# Patient Record
Sex: Male | Born: 1965 | Race: White | Hispanic: No | Marital: Married | Smoking: Former smoker
Health system: Southern US, Community
[De-identification: ages and names within clinical notes are randomized; demographics above are authoritative.]

## PROBLEM LIST (undated history)

## (undated) DIAGNOSIS — G4730 Sleep apnea, unspecified: Secondary | ICD-10-CM

## (undated) DIAGNOSIS — K219 Gastro-esophageal reflux disease without esophagitis: Secondary | ICD-10-CM

## (undated) DIAGNOSIS — E538 Deficiency of other specified B group vitamins: Secondary | ICD-10-CM

## (undated) DIAGNOSIS — I10 Essential (primary) hypertension: Secondary | ICD-10-CM

## (undated) DIAGNOSIS — F419 Anxiety disorder, unspecified: Secondary | ICD-10-CM

## (undated) DIAGNOSIS — M1990 Unspecified osteoarthritis, unspecified site: Secondary | ICD-10-CM

## (undated) HISTORY — PX: BACK SURGERY: SHX140

## (undated) HISTORY — PX: FRACTURE SURGERY: SHX138

## (undated) HISTORY — PX: TONSILLECTOMY: SUR1361

---

## 2016-05-03 ENCOUNTER — Ambulatory Visit (HOSPITAL_COMMUNITY)
Admission: EM | Admit: 2016-05-03 | Discharge: 2016-05-03 | Disposition: A | Discharge status: Home / Self Care | Payer: BLUE CROSS/BLUE SHIELD | Attending: Emergency Medicine | Admitting: Emergency Medicine

## 2016-05-03 ENCOUNTER — Encounter (HOSPITAL_COMMUNITY): Payer: Self-pay

## 2016-05-03 DIAGNOSIS — Z20828 Contact with and (suspected) exposure to other viral communicable diseases: Secondary | ICD-10-CM

## 2016-05-03 DIAGNOSIS — R6889 Other general symptoms and signs: Secondary | ICD-10-CM

## 2016-05-03 MED ORDER — BENZONATATE 100 MG PO CAPS: 100.0000 mg | ORAL_CAPSULE | Freq: Three times a day (TID) | ORAL | 0 refills | Status: DC

## 2016-05-03 MED ORDER — OSELTAMIVIR PHOSPHATE 75 MG PO CAPS: 75.0000 mg | ORAL_CAPSULE | Freq: Two times a day (BID) | ORAL | 0 refills | Status: DC

## 2016-05-03 MED ORDER — ACETAMINOPHEN 325 MG PO TABS
ORAL_TABLET | ORAL | Status: AC
  Filled 2016-05-03: qty 3

## 2016-05-03 MED ORDER — FLUTICASONE PROPIONATE 50 MCG/ACT NA SUSP: 2.0000 | Freq: Every day | NASAL | 2 refills | Status: AC

## 2016-05-03 MED ORDER — ACETAMINOPHEN 325 MG PO TABS
975.0000 mg | ORAL_TABLET | Freq: Once | ORAL | Status: AC
  Administered 2016-05-03: 975 mg via ORAL

## 2016-05-03 NOTE — ED Provider Notes (Signed)
CSN: [id number]     Arrival date & time 05/03/16  1708 History   First MD Initiated Contact with Patient 05/03/16 1828     Chief Complaint  Patient presents with  . Influenza   (Consider location/radiation/quality/duration/timing/severity/associated sxs/prior Treatment) HPI  Martin Burns is a 51 y.o. male presenting to UC with c/o sudden onset flu-like symptoms that started about 2-3 days ago.  Pt woke up the other day feeling a little sore but went to work, he then developed diffuse body aches, generalized headache, cough, congestion, and chills. Body aches are most bothersome for the pt.  Wife notes their son was dx with influenza last week. Pt has not received the flu vaccine this year. Denies n/v/d. Denies hx of asthma.    History reviewed. No pertinent past medical history. Past Surgical History:  Procedure Laterality Date  . BACK SURGERY     History reviewed. No pertinent family history. Social History  Substance Use Topics  . Smoking status: Never Smoker  . Smokeless tobacco: Never Used  . Alcohol use Yes    Review of Systems  Constitutional: Positive for chills and fatigue. Negative for fever.  HENT: Positive for congestion, postnasal drip and rhinorrhea. Negative for ear pain, sore throat, trouble swallowing and voice change.   Respiratory: Positive for cough. Negative for shortness of breath.   Cardiovascular: Negative for chest pain and palpitations.  Gastrointestinal: Negative for abdominal pain, diarrhea, nausea and vomiting.  Musculoskeletal: Positive for arthralgias and myalgias. Negative for back pain.       Body aches  Skin: Negative for rash.  Neurological: Positive for headaches. Negative for dizziness and light-headedness.    Allergies  Codeine  Home Medications   Prior to Admission medications   Medication Sig Start Date End Date Taking? Authorizing Provider  benzonatate (TESSALON) 100 MG capsule Take 1 capsule (100 mg total) by mouth every 8  (eight) hours. 05/03/16   Junius Finner, PA-C  fluticasone (FLONASE) 50 MCG/ACT nasal spray Place 2 sprays into both nostrils daily. 05/03/16   Junius Finner, PA-C  oseltamivir (TAMIFLU) 75 MG capsule Take 1 capsule (75 mg total) by mouth every 12 (twelve) hours. 05/03/16   Junius Finner, PA-C   Meds Ordered and Administered this Visit   Medications  acetaminophen (TYLENOL) tablet 975 mg (975 mg Oral Given 05/03/16 1845)    BP 145/90 (BP Location: Left Arm)   Pulse 105   Temp 100.9 F (38.3 C) (Oral)   Resp 17   SpO2 99%  No data found.   Physical Exam  Constitutional: He appears well-developed and well-nourished. No distress.  HENT:  Head: Normocephalic and atraumatic.  Right Ear: Tympanic membrane normal.  Left Ear: Tympanic membrane normal.  Nose: Mucosal edema present. Right sinus exhibits no maxillary sinus tenderness and no frontal sinus tenderness. Left sinus exhibits no maxillary sinus tenderness and no frontal sinus tenderness.  Mouth/Throat: Uvula is midline and mucous membranes are normal. Posterior oropharyngeal erythema present. No oropharyngeal exudate, posterior oropharyngeal edema or tonsillar abscesses.  Eyes: Conjunctivae are normal. No scleral icterus.  Neck: Normal range of motion. Neck supple.  Cardiovascular: Normal rate, regular rhythm and normal heart sounds.   Pulmonary/Chest: Effort normal and breath sounds normal. No stridor. No respiratory distress. He has no wheezes. He has no rales.  Abdominal: Soft. He exhibits no distension. There is no tenderness.  Musculoskeletal: Normal range of motion.  Lymphadenopathy:    He has no cervical adenopathy.  Neurological: He is alert.  Skin:  Skin is warm and dry. He is not diaphoretic.  Nursing note and vitals reviewed.   Urgent Care Course     Procedures (including critical care time)  Labs Review Labs Reviewed - No data to display  Imaging Review No results found.    MDM   1. Flu-like symptoms   2.  Exposure to the flu    Pt presenting to UC with 2-3 days of flu-like symptoms. Known exposure to the flu by pt's son.  No evidence of underlying bacterial infection at this time.  Discussed risks/benefits of Tamiflu. Pt would like to try the treatment. Rx: Tamiflu, tessalon and flonase  Advised pt to use acetaminophen and ibuprofen as needed for fever and pain. Encouraged rest and fluids. F/u with PCP in 5-7 days if not improving, sooner if worsening. Pt verbalized understanding and agreement with tx plan.      Junius Finnerrin O'Malley, PA-C 05/03/16 1928

## 2016-05-03 NOTE — Discharge Instructions (Signed)
You Martin Burns take 400-600mg Ibuprofen (Motrin) every 6-8 hours for fever and pain  °Alternate with Tylenol  °You Martin Burns take 500mg Tylenol every 4-6 hours as needed for fever and pain  °Follow-up with your primary care provider next week for recheck of symptoms if not improving.  °Be sure to drink plenty of fluids and rest, at least 8hrs of sleep a night, preferably more while you are sick. °Return urgent care or go to closest ER if you cannot keep down fluids/signs of dehydration, fever not reducing with Tylenol, difficulty breathing/wheezing, stiff neck, worsening condition, or other concerns (see below)  ° ° ° °

## 2016-05-03 NOTE — ED Triage Notes (Signed)
[facility] with possible Flu, pt states he woke up on Friday morning not feeling well went to work, still not feeling well, woke up this morning feeling worse, pt has complaints of chills and  generalized body aches x3 days

## 2017-09-13 ENCOUNTER — Other Ambulatory Visit: Payer: Self-pay | Admitting: Orthopedic Surgery

## 2017-09-13 DIAGNOSIS — L720 Epidermal cyst: Secondary | ICD-10-CM

## 2017-09-20 ENCOUNTER — Other Ambulatory Visit: Payer: BLUE CROSS/BLUE SHIELD

## 2017-09-21 ENCOUNTER — Ambulatory Visit
Admission: RE | Admit: 2017-09-21 | Discharge: 2017-09-21 | Disposition: A | Discharge status: Home / Self Care | Payer: Worker's Compensation | Source: Ambulatory Visit | Attending: Orthopedic Surgery | Admitting: Orthopedic Surgery

## 2017-09-21 DIAGNOSIS — L720 Epidermal cyst: Secondary | ICD-10-CM

## 2017-09-21 MED ORDER — GADOBENATE DIMEGLUMINE 529 MG/ML IV SOLN
20.0000 mL | Freq: Once | INTRAVENOUS | Status: AC | PRN
  Administered 2017-09-21: 20 mL via INTRAVENOUS

## 2018-01-24 ENCOUNTER — Other Ambulatory Visit: Payer: Self-pay | Admitting: Internal Medicine

## 2018-01-24 DIAGNOSIS — M5440 Lumbago with sciatica, unspecified side: Secondary | ICD-10-CM

## 2018-02-14 ENCOUNTER — Ambulatory Visit
Admission: RE | Admit: 2018-02-14 | Discharge: 2018-02-14 | Disposition: A | Discharge status: Home / Self Care | Payer: BLUE CROSS/BLUE SHIELD | Source: Ambulatory Visit | Attending: Internal Medicine | Admitting: Internal Medicine

## 2018-02-14 DIAGNOSIS — M5136 Other intervertebral disc degeneration, lumbar region: Secondary | ICD-10-CM | POA: Insufficient documentation

## 2018-02-14 DIAGNOSIS — M48061 Spinal stenosis, lumbar region without neurogenic claudication: Secondary | ICD-10-CM | POA: Diagnosis not present

## 2018-02-14 DIAGNOSIS — G8929 Other chronic pain: Secondary | ICD-10-CM | POA: Insufficient documentation

## 2018-02-14 DIAGNOSIS — M5441 Lumbago with sciatica, right side: Secondary | ICD-10-CM | POA: Diagnosis not present

## 2018-02-14 DIAGNOSIS — M5440 Lumbago with sciatica, unspecified side: Secondary | ICD-10-CM

## 2018-02-24 ENCOUNTER — Other Ambulatory Visit: Payer: Self-pay | Admitting: Neurosurgery

## 2018-02-24 DIAGNOSIS — G9589 Other specified diseases of spinal cord: Secondary | ICD-10-CM

## 2018-03-17 ENCOUNTER — Ambulatory Visit
Admission: RE | Admit: 2018-03-17 | Discharge: 2018-03-17 | Disposition: A | Discharge status: Home / Self Care | Payer: BLUE CROSS/BLUE SHIELD | Source: Ambulatory Visit | Attending: Neurosurgery | Admitting: Neurosurgery

## 2018-03-17 DIAGNOSIS — M47816 Spondylosis without myelopathy or radiculopathy, lumbar region: Secondary | ICD-10-CM | POA: Diagnosis not present

## 2018-03-17 DIAGNOSIS — G9589 Other specified diseases of spinal cord: Secondary | ICD-10-CM | POA: Diagnosis present

## 2018-03-17 DIAGNOSIS — M5136 Other intervertebral disc degeneration, lumbar region: Secondary | ICD-10-CM | POA: Insufficient documentation

## 2018-03-17 MED ORDER — GADOBUTROL 1 MMOL/ML IV SOLN
10.0000 mL | Freq: Once | INTRAVENOUS | Status: AC | PRN
  Administered 2018-03-17: 10 mL via INTRAVENOUS

## 2018-03-28 ENCOUNTER — Other Ambulatory Visit: Payer: Self-pay | Admitting: Neurosurgery

## 2018-03-28 DIAGNOSIS — G9589 Other specified diseases of spinal cord: Secondary | ICD-10-CM

## 2018-04-27 ENCOUNTER — Encounter
Admission: RE | Disposition: A | Discharge status: Home / Self Care | Payer: Self-pay | Source: Home / Self Care | Attending: Internal Medicine

## 2018-04-27 ENCOUNTER — Ambulatory Visit
Admission: RE | Admit: 2018-04-27 | Discharge: 2018-04-27 | Disposition: A | Discharge status: Home / Self Care | Payer: BLUE CROSS/BLUE SHIELD | Attending: Internal Medicine | Admitting: Internal Medicine

## 2018-04-27 ENCOUNTER — Encounter: Payer: Self-pay | Admitting: Anesthesiology

## 2018-04-27 ENCOUNTER — Ambulatory Visit: Payer: BLUE CROSS/BLUE SHIELD | Admitting: Anesthesiology

## 2018-04-27 DIAGNOSIS — Z1211 Encounter for screening for malignant neoplasm of colon: Secondary | ICD-10-CM | POA: Insufficient documentation

## 2018-04-27 DIAGNOSIS — K219 Gastro-esophageal reflux disease without esophagitis: Secondary | ICD-10-CM | POA: Diagnosis not present

## 2018-04-27 DIAGNOSIS — Z8371 Family history of colonic polyps: Secondary | ICD-10-CM | POA: Diagnosis not present

## 2018-04-27 DIAGNOSIS — K5730 Diverticulosis of large intestine without perforation or abscess without bleeding: Secondary | ICD-10-CM | POA: Insufficient documentation

## 2018-04-27 DIAGNOSIS — Z800 Family history of malignant neoplasm of digestive organs: Secondary | ICD-10-CM | POA: Insufficient documentation

## 2018-04-27 DIAGNOSIS — K228 Other specified diseases of esophagus: Secondary | ICD-10-CM | POA: Diagnosis not present

## 2018-04-27 DIAGNOSIS — Z791 Long term (current) use of non-steroidal anti-inflammatories (NSAID): Secondary | ICD-10-CM | POA: Insufficient documentation

## 2018-04-27 DIAGNOSIS — K640 First degree hemorrhoids: Secondary | ICD-10-CM | POA: Diagnosis not present

## 2018-04-27 HISTORY — PX: ESOPHAGOGASTRODUODENOSCOPY: SHX5428

## 2018-04-27 HISTORY — DX: Gastro-esophageal reflux disease without esophagitis: K21.9

## 2018-04-27 HISTORY — PX: COLONOSCOPY WITH PROPOFOL: SHX5780

## 2018-04-27 SURGERY — EGD (ESOPHAGOGASTRODUODENOSCOPY)
Anesthesia: General

## 2018-04-27 MED ORDER — SODIUM CHLORIDE 0.9 % IV SOLN
INTRAVENOUS | Status: DC
  Administered 2018-04-27 (×2): via INTRAVENOUS

## 2018-04-27 MED ORDER — PROPOFOL 500 MG/50ML IV EMUL
INTRAVENOUS | Status: DC | PRN
  Administered 2018-04-27: 100 ug/kg/min via INTRAVENOUS

## 2018-04-27 MED ORDER — PROPOFOL 10 MG/ML IV BOLUS
INTRAVENOUS | Status: DC | PRN
  Administered 2018-04-27: 50 mg via INTRAVENOUS

## 2018-04-27 NOTE — Interval H&P Note (Signed)
History and Physical Interval Note:  04/27/2018 12:03 PM  Martin Burns  has presented today for surgery, with the diagnosis of COLON CANCER SCREENING  The various methods of treatment have been discussed with the patient and family. After consideration of risks, benefits and other options for treatment, the patient has consented to  Procedure(s): ESOPHAGOGASTRODUODENOSCOPY (EGD) (N/A) COLONOSCOPY WITH PROPOFOL (N/A) as a surgical intervention .  The patient's history has been reviewed, patient examined, no change in status, stable for surgery.  I have reviewed the patient's chart and labs.  Questions were answered to the patient's satisfaction.     [place], [place]

## 2018-04-27 NOTE — Anesthesia Postprocedure Evaluation (Signed)
Anesthesia Post Note  Patient: Terah Viens Getchell  Procedure(s) Performed: ESOPHAGOGASTRODUODENOSCOPY (EGD) (N/A ) COLONOSCOPY WITH PROPOFOL (N/A )  Patient location during evaluation: PACU Anesthesia Type: General Level of consciousness: awake and alert Pain management: pain level controlled Vital Signs Assessment: post-procedure vital signs reviewed and stable Respiratory status: spontaneous breathing, nonlabored ventilation, respiratory function stable and patient connected to nasal cannula oxygen Cardiovascular status: stable and blood pressure returned to baseline Postop Assessment: no apparent nausea or vomiting Anesthetic complications: no     Last Vitals:  Vitals:   04/27/18 1239 04/27/18 1249  BP: 106/70 122/65  Pulse: (!) 104 90  Resp: 16 17  Temp: 37 C   SpO2: 95% 95%    Last Pain:  Vitals:   04/27/18 1237  TempSrc: Tympanic                 Yevette Edwards

## 2018-04-27 NOTE — Anesthesia Post-op Follow-up Note (Signed)
Anesthesia QCDR form completed.        

## 2018-04-27 NOTE — Op Note (Signed)
[facility] Gastroenterology Patient Name: Martin Burns Procedure Date: 04/27/2018 11:07 AM MRN: [id number] Account #: [id number] Date of Birth: [birth year] Admit Type: Outpatient Age: 53 Room: [facility] ENDO ROOM 2 Gender: Male Note Status: Finalized Procedure:            Upper GI endoscopy Indications:          Follow-up of esophageal reflux Providers:            Boykin Nearing. Norma Fredrickson MD, MD Referring MD:         Duane Lope. Judithann Sheen, MD (Referring MD) Medicines:            Propofol per Anesthesia Complications:        No immediate complications. Procedure:            Pre-Anesthesia Assessment:                       - The risks and benefits of the procedure and the                        sedation options and risks were discussed with the                        patient. All questions were answered and informed                        consent was obtained.                       - Patient identification and proposed procedure were                        verified prior to the procedure by the nurse. The                        procedure was verified in the procedure room.                       - ASA Grade Assessment: III - A patient with severe                        systemic disease.                       - After reviewing the risks and benefits, the patient                        was deemed in satisfactory condition to undergo the                        procedure.                       After obtaining informed consent, the endoscope was                        passed under direct vision. Throughout the procedure,                        the patient's blood pressure, pulse, and oxygen  saturations were monitored continuously. The Endoscope                        was introduced through the mouth, and advanced to the                        third part of duodenum. The upper GI endoscopy was                        accomplished without difficulty. The patient tolerated                         the procedure well. Findings:      The Z-line was irregular and was found at the gastroesophageal junction.       Mucosa was biopsied with a cold forceps for histology. One specimen       bottle was sent to pathology.      The entire examined stomach was normal.      The examined duodenum was normal.      The exam was otherwise without abnormality. Impression:           - Z-line irregular, at the gastroesophageal junction.                        Biopsied.                       - Normal stomach.                       - Normal examined duodenum.                       - The examination was otherwise normal. Recommendation:       - Await pathology results.                       - Proceed with colonoscopy Procedure Code(s):    --- Professional ---                       [id number], Esophagogastroduodenoscopy, flexible, transoral;                        with biopsy, single or multiple Diagnosis Code(s):    --- Professional ---                       K21.9, Gastro-esophageal reflux disease without                        esophagitis                       K22.8, Other specified diseases of esophagus CPT copyright 2018 American Medical Association. All rights reserved. The codes documented in this report are preliminary and upon coder review [place]  be revised to meet current compliance requirements. [name] MD, MD 04/27/2018 12:23:06 PM This report has been signed electronically. Number of Addenda: 0 Note Initiated On: 04/27/2018 11:07 AM      [facility]

## 2018-04-27 NOTE — Op Note (Signed)
[facility] Gastroenterology Patient Name: Martin Burns Procedure Date: 04/27/2018 11:07 AM MRN: [id number] Account #: [id number] Date of Birth: 08/10/1965 Admit Type: Outpatient Age: [age] Room: [facility] END[facility] ROOM 2 Gender: Male Note Status: Finalized Procedure:            Colonoscopy Indications:          Screening for colorectal malignant neoplasm Providers:            Boykin Nearingeodoro K. Norma Fredricksonoledo MD, MD Referring MD:         Duane LopeJeffrey D. Judithann SheenSparks, MD (Referring MD) Medicines:            Propofol per Anesthesia Complications:        No immediate complications. Procedure:            Pre-Anesthesia Assessment:                       - The risks and benefits of the procedure and the                        sedation options and risks were discussed with the                        patient. All questions were answered and informed                        consent was obtained.                       - Patient identification and proposed procedure were                        verified prior to the procedure by the nurse. The                        procedure was verified in the procedure room.                       - ASA Grade Assessment: III - A patient with severe                        systemic disease.                       - After reviewing the risks and benefits, the patient                        was deemed in satisfactory condition to undergo the                        procedure.                       After obtaining informed consent, the colonoscope was                        passed under direct vision. Throughout the procedure,                        the patient's blood pressure, pulse, and oxygen  saturations were monitored continuously. The                        Colonoscope was introduced through the anus and                        advanced to the the cecum, identified by appendiceal                        orifice and ileocecal valve. The colonoscopy was               performed without difficulty. The patient tolerated the                        procedure well. The quality of the bowel preparation                        was good. The ileocecal valve, appendiceal orifice, and                        rectum were photographed. Findings:      The perianal and digital rectal examinations were normal. Pertinent       negatives include normal sphincter tone and no palpable rectal lesions.      Many small and large-mouthed diverticula were found in the sigmoid colon.      Non-bleeding internal hemorrhoids were found during retroflexion. The       hemorrhoids were Grade I (internal hemorrhoids that do not prolapse).      The exam was otherwise without abnormality. Impression:           - Diverticulosis in the sigmoid colon.                       - Non-bleeding internal hemorrhoids.                       - The examination was otherwise normal.                       - No specimens collected. Recommendation:       - Patient has a contact number available for                        emergencies. The signs and symptoms of potential                        delayed complications were discussed with the patient.                        Return to normal activities tomorrow. Written discharge                        instructions were provided to the patient.                       - Resume previous diet.                       - Continue present medications.                       - Repeat colonoscopy  in 10 years for screening purposes.                       - Await pathology results from EGD, also performed                        today.                       - Return to physician assistant in 1 year.                       - The findings and recommendations were discussed with                        the patient and their family. Procedure Code(s):    --- Professional ---                       G0121, Colorectal cancer screening; colonoscopy on Z6109                        individual not meeting criteria for high risk Diagnosis Code(s):    --- Professional ---                       K57.30, Diverticulosis of large intestine without                        perforation or abscess without bleeding                       K64.0, First degree hemorrhoids                       Z12.11, Encounter for screening for malignant neoplasm                        of colon CPT copyright 2018 American Medical Association. All rights reserved. The codes documented in this report are preliminary and upon coder review [name]  be revised to meet current compliance requirements. Stanton Kidneyeodoro K Aitanna Haubner MD, MD 04/27/2018 12:34:48 PM This report has been signed electronically. Number of Addenda: 0 Note Initiated On: 04/27/2018 11:07 AM Scope Withdrawal Time: 0 hours 4 minutes 48 seconds  Total Procedure Duration: 0 hours 7 minutes 17 seconds       [facility]

## 2018-04-27 NOTE — H&P (Signed)
Outpatient short stay form Pre-procedure 04/27/2018 10:34 AM Martin Burns [name], M.D.  Primary Physician: Aram Beecham, M.D.  Reason for visit:  GERD, Family history of colon polyps (Father).  History of present illness:  Patient has a hx of uncomplicated GERD, episodic.   53 year old patient presenting for family history of colon cancer. Patient denies any change in bowel habits, rectal bleeding or involuntary weight loss.   No current facility-administered medications for this encounter.   Current Outpatient Medications:  .  bisoprolol-hydrochlorothiazide (ZIAC) 5-6.25 MG tablet, Take 1 tablet by mouth daily., Disp: , Rfl:  .  diazepam (VALIUM) 2 MG tablet, Take 2 mg by mouth every 8 (eight) hours as needed for anxiety., Disp: , Rfl:  .  GLUCOSAMINE SULFATE-MSM PO, Take 2 tablets by mouth daily., Disp: , Rfl:  .  ibuprofen (ADVIL,MOTRIN) 800 MG tablet, Take 800 mg by mouth every 8 (eight) hours as needed., Disp: , Rfl:  .  Multiple Vitamins-Minerals (MULTIVITAMIN ADULT PO), Take 1 tablet by mouth daily., Disp: , Rfl:  .  benzonatate (TESSALON) 100 MG capsule, Take 1 capsule (100 mg total) by mouth every 8 (eight) hours., Disp: 21 capsule, Rfl: 0 .  fluticasone (FLONASE) 50 MCG/ACT nasal spray, Place 2 sprays into both nostrils daily., Disp: 16 g, Rfl: 2 .  oseltamivir (TAMIFLU) 75 MG capsule, Take 1 capsule (75 mg total) by mouth every 12 (twelve) hours., Disp: 10 capsule, Rfl: 0  No medications prior to admission.     Allergies  Allergen Reactions  . Codeine Nausea And Vomiting     Past Medical History:  Diagnosis Date  . GERD (gastroesophageal reflux disease)     Review of systems:  Otherwise negative.    Physical Exam  Gen: Alert, oriented. Appears stated age.  HEENT: [place]/AT. PERRLA. Lungs: CTA, no wheezes. CV: RR nl S1, S2. Abd: soft, benign, no masses. BS+ Ext: No edema. Pulses 2+    Planned procedures: Proceed with EGD and colonoscopy. The patient  understands the nature of the planned procedure, indications, risks, alternatives and potential complications including but not limited to bleeding, infection, perforation, damage to internal organs and possible oversedation/side effects from anesthesia. The patient agrees and gives consent to proceed.  Please refer to procedure notes for findings, recommendations and patient disposition/instructions.     Contina Strain [name], M.D. Gastroenterology 04/27/2018  10:34 AM

## 2018-04-27 NOTE — Anesthesia Preprocedure Evaluation (Signed)
Anesthesia Evaluation  Patient identified by MRN, date of birth, ID band Patient awake    Reviewed: Allergy & Precautions, H&P , NPO status , Patient's Chart, lab work & pertinent test results  History of Anesthesia Complications Negative for: history of anesthetic complications  Airway Mallampati: III  TM Distance: <3 FB Neck ROM: limited    Dental  (+) Chipped, Poor Dentition   Pulmonary neg shortness of breath, former smoker,  Signs and symptoms suggestive of sleep apnea            Cardiovascular Exercise Tolerance: Good (-) angina(-) Past MI and (-) DOE negative cardio ROS       Neuro/Psych negative neurological ROS  negative psych ROS   GI/Hepatic Neg liver ROS, GERD  Medicated and Controlled,  Endo/Other  negative endocrine ROS  Renal/GU negative Renal ROS  negative genitourinary   Musculoskeletal   Abdominal   Peds  Hematology negative hematology ROS (+)   Anesthesia Other Findings Past Medical History: No date: GERD (gastroesophageal reflux disease)  Past Surgical History: No date: BACK SURGERY  BMI    Body Mass Index:  41.35 kg/m      Reproductive/Obstetrics negative OB ROS                             Anesthesia Physical Anesthesia Plan  ASA: III  Anesthesia Plan: General   Post-op Pain Management:    Induction: Intravenous  PONV Risk Score and Plan: Propofol infusion and TIVA  Airway Management Planned: Natural Airway and Nasal Cannula  Additional Equipment:   Intra-op Plan:   Post-operative Plan:   Informed Consent: I have reviewed the patients History and Physical, chart, labs and discussed the procedure including the risks, benefits and alternatives for the proposed anesthesia with the patient or authorized representative who has indicated his/her understanding and acceptance.     Dental Advisory Given  Plan Discussed with: Anesthesiologist,  CRNA and Surgeon  Anesthesia Plan Comments: (Patient consented for risks of anesthesia including but not limited to:  - adverse reactions to medications - risk of intubation if required - damage to teeth, lips or other oral mucosa - sore throat or hoarseness - Damage to heart, brain, lungs or loss of life  Patient voiced understanding.)        Anesthesia Quick Evaluation

## 2018-04-27 NOTE — Transfer of Care (Signed)
Immediate Anesthesia Transfer of Care Note  Patient: Martin Burns  Procedure(s) Performed: ESOPHAGOGASTRODUODENOSCOPY (EGD) (N/A ) COLONOSCOPY WITH PROPOFOL (N/A )  Patient Location: PACU  Anesthesia Type:General  Level of Consciousness: awake and alert   Airway & Oxygen Therapy: Patient Spontanous Breathing  Post-op Assessment: Report given to RN  Post vital signs: Reviewed and stable  Last Vitals:  Vitals Value Taken Time  BP 106/70 04/27/2018 12:39 PM  Temp 37 C 04/27/2018 12:39 PM  Pulse 102 04/27/2018 12:40 PM  Resp 19 04/27/2018 12:40 PM  SpO2 95 % 04/27/2018 12:40 PM  Vitals shown include unvalidated device data.  Last Pain:  Vitals:   04/27/18 1237  TempSrc: Tympanic         Complications: No apparent anesthesia complications

## 2018-04-28 LAB — SURGICAL PATHOLOGY

## 2018-05-06 ENCOUNTER — Ambulatory Visit: Payer: BLUE CROSS/BLUE SHIELD | Attending: Neurology

## 2018-05-06 DIAGNOSIS — G4730 Sleep apnea, unspecified: Secondary | ICD-10-CM | POA: Diagnosis present

## 2018-06-24 ENCOUNTER — Ambulatory Visit: Payer: BLUE CROSS/BLUE SHIELD

## 2018-06-27 ENCOUNTER — Other Ambulatory Visit: Payer: Self-pay

## 2018-06-27 ENCOUNTER — Ambulatory Visit
Admission: RE | Admit: 2018-06-27 | Discharge: 2018-06-27 | Disposition: A | Discharge status: Home / Self Care | Payer: BLUE CROSS/BLUE SHIELD | Source: Ambulatory Visit | Attending: Neurosurgery | Admitting: Neurosurgery

## 2018-06-27 DIAGNOSIS — G9589 Other specified diseases of spinal cord: Secondary | ICD-10-CM | POA: Insufficient documentation

## 2018-06-27 LAB — POCT I-STAT CREATININE: CREATININE: 0.9 mg/dL (ref 0.61–1.24)

## 2018-06-27 MED ORDER — GADOBUTROL 1 MMOL/ML IV SOLN
10.0000 mL | Freq: Once | INTRAVENOUS | Status: AC | PRN
  Administered 2018-06-27: 10 mL via INTRAVENOUS

## 2020-03-16 IMAGING — MR MR LUMBAR SPINE WO/W CM
6 of 7 series · 31 of 48 positions shown · IV contrast (Gadavist)
Comparison: 02/14/2018

CLINICAL DATA: Follow-up previous abnormal MRI.

EXAM:
MRI LUMBAR SPINE WITHOUT AND WITH CONTRAST
TECHNIQUE: Multiplanar and multiecho pulse sequences of the lumbar spine were
obtained without and with intravenous contrast.
CONTRAST:  10 cc Gadavist

[Series 5: T1 · sagittal · 4.0mm · 0.81mm/px · 5 of 17 slices shown (1 of 2)]
[im 1/17]
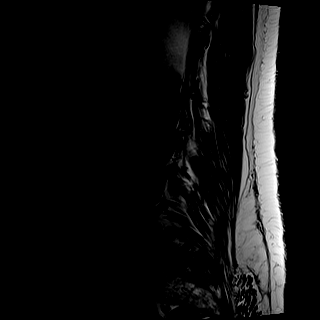
[im 5/17]
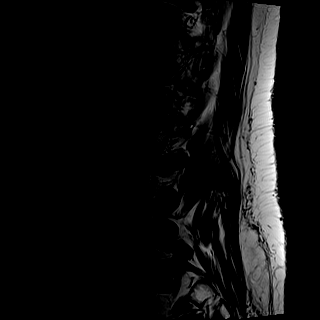
[im 9/17]
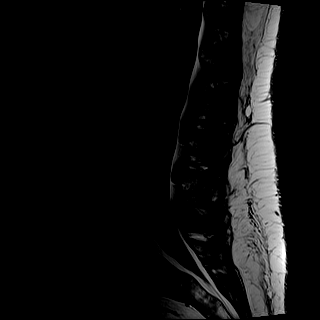
[im 13/17]
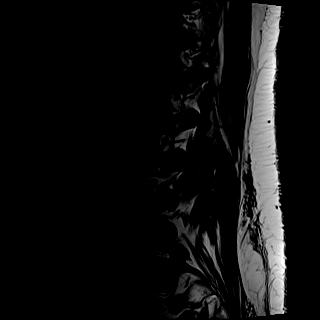
[im 17/17]
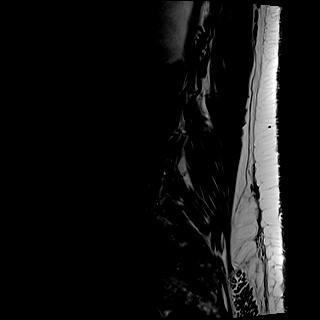

[Series 5: T1 fat-sat post-contrast · sagittal · 4.0mm · 0.81mm/px · 5 of 17 slices shown]
[im 1/17]
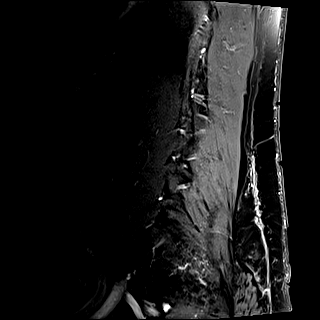
[im 5/17]
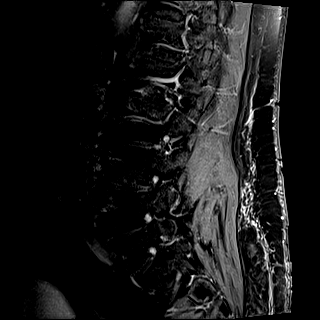
[im 9/17]
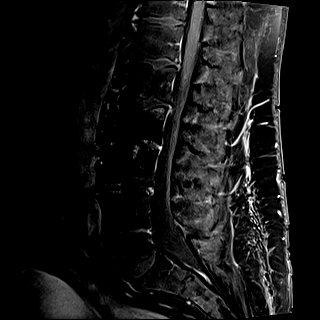
[im 13/17]
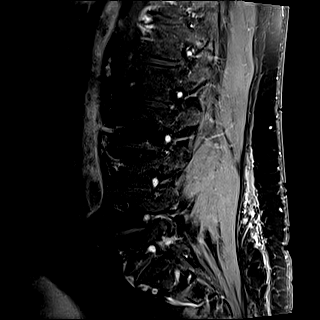
[im 17/17]
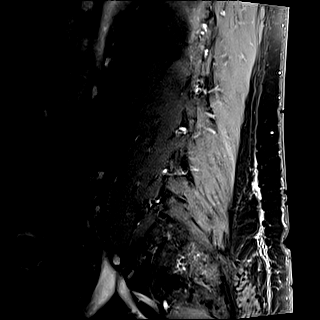

[Series 6: STIR · sagittal · 4.0mm · 0.41mm/px · 1 of 17 slices shown]
[im 1/17]
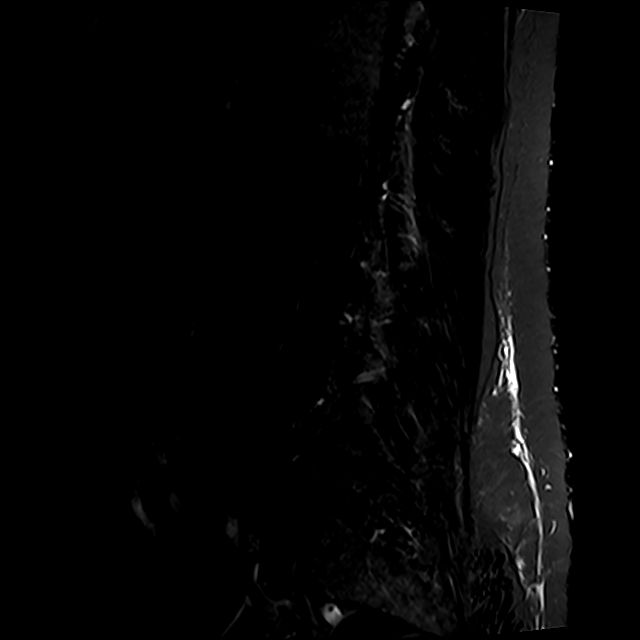

[Series 7: T2 · axial · 4.0mm · 0.78mm/px · z∈[-179,+65]mm · 8 of 42 slices shown (1 of 2)]
[im 1/42]
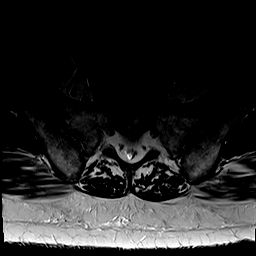
[im 5/42]
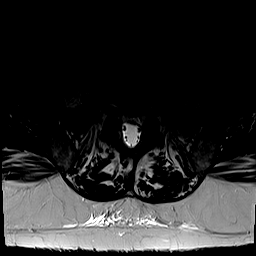
[im 14/42]
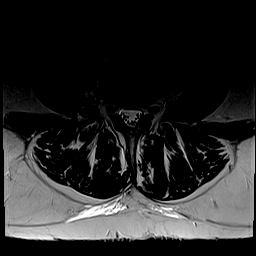
[im 19/42]
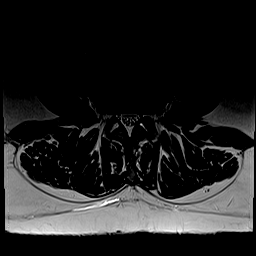
[im 23/42]
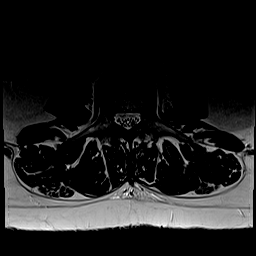
[im 28/42]
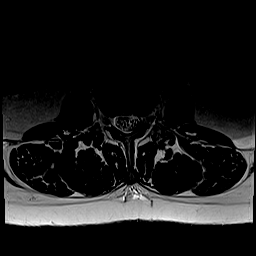
[im 37/42]
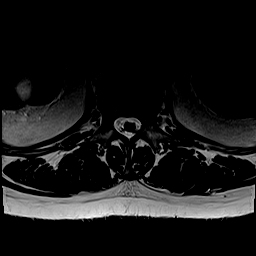
[im 42/42]
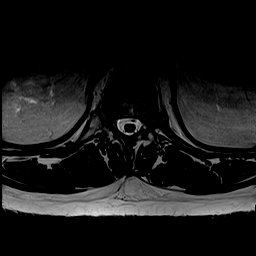

[Series 8: T1 · axial · 4.0mm · 0.39mm/px · z∈[-179,+65]mm · 8 of 42 slices shown (2 of 2)]
[im 1/42]
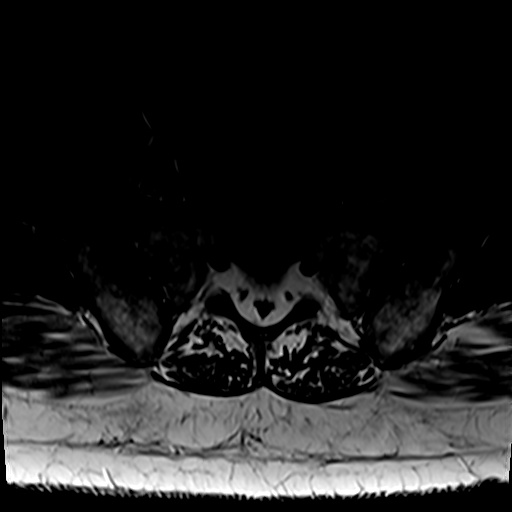
[im 5/42]
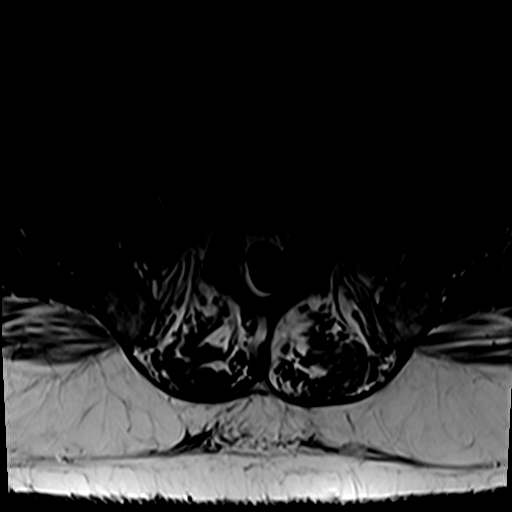
[im 14/42]
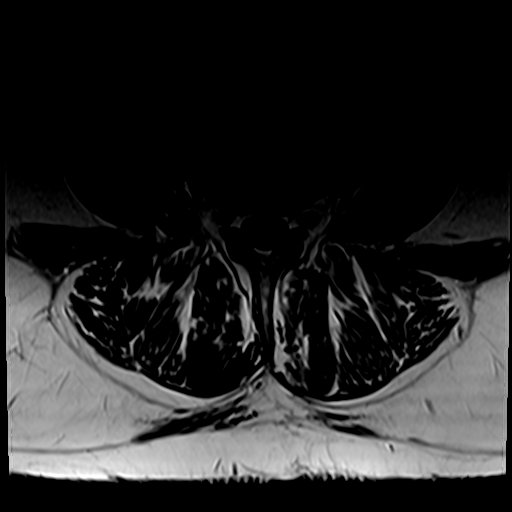
[im 19/42]
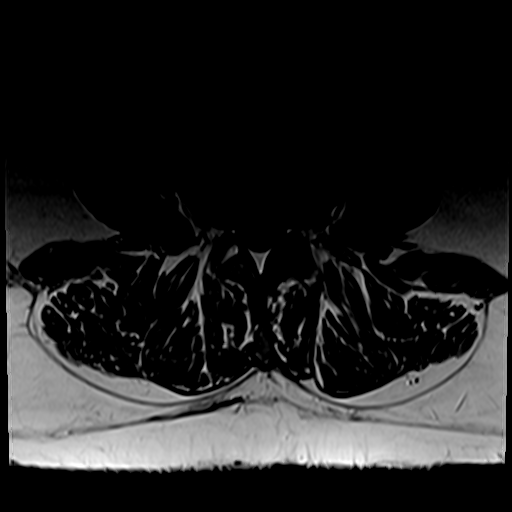
[im 23/42]
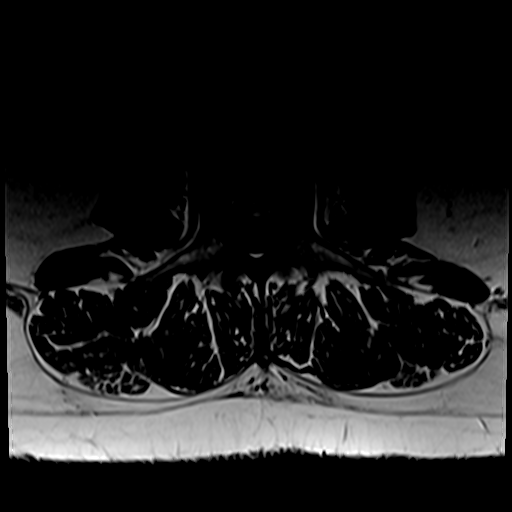
[im 28/42]
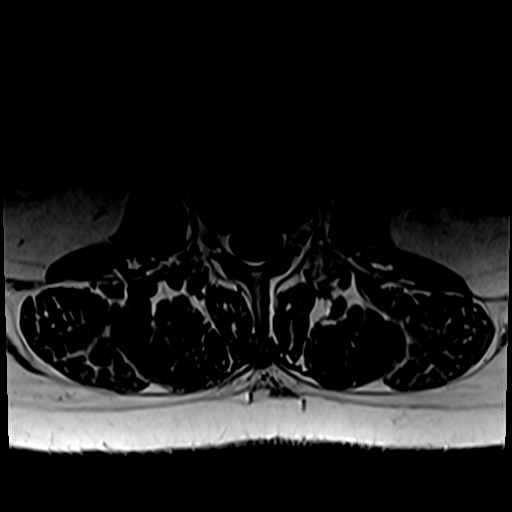
[im 37/42]
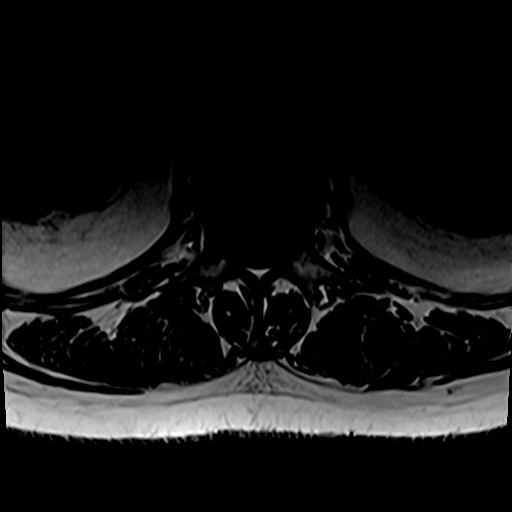
[im 42/42]
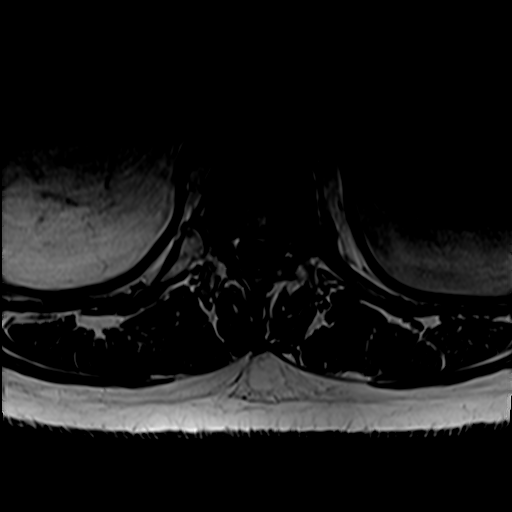

[Series 9: T2 · sagittal · 4.0mm · 0.81mm/px · 4 of 17 slices shown (2 of 2)]
[im 1/17]
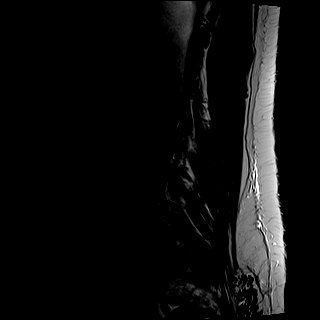
[im 6/17]
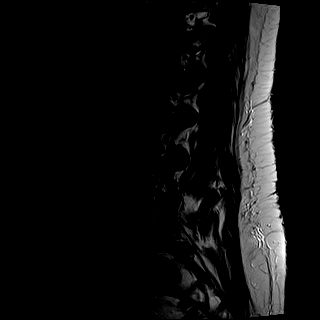
[im 11/17]
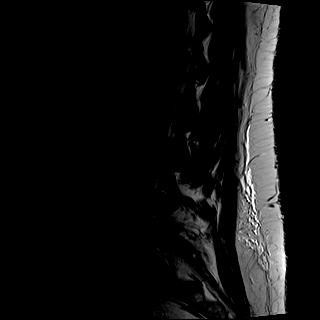
[im 17/17]
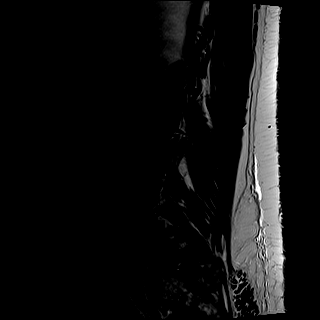

[31 of 48 positions shown; findings below may reference images not displayed]

FINDINGS: Segmentation: There are five lumbar type vertebral bodies. The last
full intervertebral disc space is labeled L5-S1. This corresponds
with the prior MRI.

Alignment: Suspect bilateral pars defects at L5 with a grade 1
spondylolisthesis. Stable postoperative changes at L5-S1 with
decompressive laminectomy.

Vertebrae: No bone lesions or fractures. Advanced fatty endplate
reactive changes are again demonstrated.

Conus medullaris and cauda equina: Conus extends to the L1 level.
Conus and cauda equina appear normal.

Paraspinal and other soft tissues: No significant findings. Stable
right renal cyst.

Disc levels:

T12-L1: As suspected on the prior MRI there is a small enhancing
nodule at the disc space level which appears to be intradural. There
is also some enhancement of the posterior aspect of the disc and 80
enhancing annular fissure. This could be a small intradural disc
herniation or a small nerve sheath tumor. Recommend neurosurgical
consultation and correlation with patient's symptoms.

The other intervertebral disc space labels are stable as recently
reported.
IMPRESSION: 1. Small, 4 mm focus of enhancement noted along the right aspect of
the conus at the T12-L1 level. This could be a small intradural disc
protrusion or possibly a small nerve root tumor. Recommend neuro
surgery consultation and correlation with patient's symptoms.
2. Stable degenerative lumbar spondylosis with disc disease and
facet disease.

## 2021-01-03 ENCOUNTER — Other Ambulatory Visit: Payer: Self-pay | Admitting: General Surgery

## 2021-01-03 NOTE — Progress Notes (Signed)
Subjective:     Patient ID: Martin Burns is a 55 y.o. male.   HPI   The following portions of the patient's history were reviewed and updated as appropriate.   This a new patient is here today for: office visit. The patient has been referred today by Dr. Doy Hutching for evaluation of an umbilical hernia.    Patient states that he has it for 20 years but it does not bother him. He states bowel movements are every day or every other day.   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      Chief Complaint  Patient presents with   Umbilical Hernia      BP (!) 142/72   Pulse 93   Temp 36.8 C (98.2 F)   Ht 175.3 cm (_0 )   Wt (!) 132 kg (291 lb)   SpO2 95%   BMI 42.97 kg/m        Past Medical History:  Diagnosis Date   Allergy 2005   Arthritis     Back pain     GERD without esophagitis     Hypertension 01/21/18   Iron deficiency anemia secondary to inadequate dietary iron intake 12/11/2020   Low serum HDL     Muscle spasm     Sleep apnea 09/ 19    Getting check for it           Past Surgical History:  Procedure Laterality Date   COLONOSCOPY   04/27/2018    Diverticulosis/Otherwise normal colon/Repeat 92yr/TKT   EGD   04/27/2018    GERD/No Repeat/TKT   HERNIA REPAIR   1999    Discetomy   SPINE SURGERY       TONSILLECTOMY        When i was younger          Social History           Socioeconomic History   Marital status: Married  Tobacco Use   Smoking status: Former Smoker      Packs/day: 0.00      Years: 0.00      Pack years: 0.00      Types: Cigarettes      Quit date: 2004      Years since quitting: 17.9   Smokeless tobacco: Current User      Types: Chew   Tobacco comment: uses dip  Vaping Use   Vaping Use: Never used  Substance and Sexual Activity   Alcohol use: Yes      Alcohol/week: 0.0 standard drinks      Comment: Once a week   Drug use: Never   Sexual activity: Yes      Partners: Female      Birth  control/protection: I.U.D.            Allergies  Allergen Reactions   Codeine Nausea and Vomiting      Current Medications        Current Outpatient Medications  Medication Sig Dispense Refill   amLODIPine-benazepril (LOTREL) 5-40 mg capsule Take 1 capsule by mouth once daily 90 capsule 3   cyanocobalamin (VITAMIN B12) 1,000 mcg/mL injection Inject 1 ml intramuscular once a week for 4 weeks then I ml once a month thereafter as directed. 6 mL 3   diazePAM (VALIUM) 2 MG tablet Take 1 tablet (2 mg total) by mouth every 8 (eight) hours as needed for Anxiety 30 tablet 5   diclofenac (VOLTAREN) 75 MG EC tablet  TAKE 1 TABLET(75 MG) BY MOUTH TWICE DAILY WITH MEALS 60 tablet 5   ferrous sulfate 325 (65 FE) MG tablet Take 1 tablet (325 mg total) by mouth daily with breakfast 100 tablet 3   fluocinonide (LIDEX) 0.05 % ointment Apply topically 2 (two) times daily 30 g 0   multivitamin-Ca-iron-minerals Tab Take by mouth       omeprazole (PRILOSEC) 20 MG DR capsule Take 1 capsule (20 mg total) by mouth once daily 90 capsule 1    No current facility-administered medications for this visit.             Family History  Problem Relation Age of Onset   High blood pressure (Hypertension) Mother     Thyroid disease Mother     Diabetes type II Father     Coronary Artery Disease (Blocked arteries around heart) Father     High blood pressure (Hypertension) Father     High blood pressure (Hypertension) Sister     Colon cancer Neg Hx     Breast cancer Neg Hx          Labs and Radiology:      WBC (White Blood Cell Count) 4.1 - 10.2 10^3/uL 6.9   RBC (Red Blood Cell Count) 4.69 - 6.13 10^6/uL 4.89   Hemoglobin 14.1 - 18.1 gm/dL 14.7   Hematocrit 40.0 - 52.0 % 43.2   MCV (Mean Corpuscular Volume) 80.0 - 100.0 fl 88.3   MCH (Mean Corpuscular Hemoglobin) 27.0 - 31.2 pg 30.1   MCHC (Mean Corpuscular Hemoglobin Concentration) 32.0 - 36.0 gm/dL 34.0   Platelet Count 150 - 450 10^3/uL 312   RDW-CV  (Red Cell Distribution Width) 11.6 - 14.8 % 13.0   MPV (Mean Platelet Volume) 9.4 - 12.4 fl 9.6   Neutrophils 1.50 - 7.80 10^3/uL 3.08   Lymphocytes 1.00 - 3.60 10^3/uL 2.85   Monocytes 0.00 - 1.50 10^3/uL 0.68   Eosinophils 0.00 - 0.55 10^3/uL 0.16   Basophils 0.00 - 0.09 10^3/uL 0.07   Neutrophil % 32.0 - 70.0 % 45.1   Lymphocyte % 10.0 - 50.0 % 41.6   Monocyte % 4.0 - 13.0 % 9.9   Eosinophil % 1.0 - 5.0 % 2.3   Basophil% 0.0 - 2.0 % 1.0   Immature Granulocyte % <=0.7 % 0.1   Immature Granulocyte Count <=0.06 10^3/L 0.01       Glucose 70 - 110 mg/dL 88   Sodium 136 - 145 mmol/L 137   Potassium 3.6 - 5.1 mmol/L 4.2   Chloride 97 - 109 mmol/L 100   Carbon Dioxide (CO2) 22.0 - 32.0 mmol/L 30.5   Urea Nitrogen (BUN) 7 - 25 mg/dL 19   Creatinine 0.7 - 1.3 mg/dL 1.2   Glomerular Filtration Rate (eGFR), MDRD Estimate >60 mL/min/1.73sq m 63   Calcium 8.7 - 10.3 mg/dL 10.3   AST  8 - 39 U/L 39   ALT  6 - 57 U/L 83 High    Alk Phos (alkaline Phosphatase) 34 - 104 U/L 74   Albumin 3.5 - 4.8 g/dL 4.5   Bilirubin, Total 0.3 - 1.2 mg/dL 0.9   Protein, Total 6.1 - 7.9 g/dL 7.4   A/G Ratio 1.0 - 5.0 gm/dL 1.6         Elevation in the serum ALT is new.     Review of Systems  Constitutional: Negative for chills and fever.  Respiratory: Negative for cough.          Objective:  Physical Exam Constitutional:      Appearance: Normal appearance.  Cardiovascular:     Rate and Rhythm: Normal rate and regular rhythm.     Pulses: Normal pulses.     Heart sounds: Normal heart sounds.  Pulmonary:     Effort: Pulmonary effort is normal.     Breath sounds: Normal breath sounds.  Abdominal:     Hernia: A hernia is present. Hernia is present in the umbilical area.       Comments: Skin inflammation below the panniculus.  Neurological:     Mental Status: He is alert and oriented to person, place, and time.  Psychiatric:        Mood and Affect: Mood normal.        Behavior: Behavior  normal.           Assessment:     Slowly increasingly symptomatic umbilical hernia.  Incarcerated omentum.    Plan:     Indications for elective repair were reviewed.  It is difficult to assess the fascial defect size, but its likely going to be over 2 cm.  Considering his abdominal girth he Boggan do better with mesh reinforcement.  Pros and cons of mesh repair were reviewed.  1% increased risk of infection discussed.  Patient is amenable to mesh if indicated.   Reviewed restrictions after surgery including weight restriction of 10 pounds, no driving until pain-free.   Surgery is tentatively scheduled for February 19, 2021.   Patient to be scheduled for surgery for 02-19-21 at [facility].       This note is partially prepared by Ledell Noss, CMA acting as a scribe in the presence of Dr. Hervey Ard, MD.    The documentation recorded by the scribe accurately reflects the service I personally performed and the decisions made by me.    Robert Bellow, MD FACS

## 2021-02-07 ENCOUNTER — Other Ambulatory Visit: Payer: Self-pay

## 2021-02-07 ENCOUNTER — Encounter
Admission: RE | Admit: 2021-02-07 | Discharge: 2021-02-07 | Disposition: A | Discharge status: Home / Self Care | Payer: BC Managed Care – PPO | Source: Ambulatory Visit | Attending: General Surgery | Admitting: General Surgery

## 2021-02-07 HISTORY — DX: Anxiety disorder, unspecified: F41.9

## 2021-02-07 HISTORY — DX: Deficiency of other specified B group vitamins: E53.8

## 2021-02-07 HISTORY — DX: Sleep apnea, unspecified: G47.30

## 2021-02-07 HISTORY — DX: Unspecified osteoarthritis, unspecified site: M19.90

## 2021-02-07 HISTORY — DX: Essential (primary) hypertension: I10

## 2021-02-07 NOTE — Patient Instructions (Signed)
Your procedure is scheduled on: 02/19/21 Report to DAY SURGERY DEPARTMENT LOCATED ON 2ND FLOOR MEDICAL MALL ENTRANCE. To find out your arrival time please call [phone] between 1PM - 3PM on 02/18/21.  Remember: Instructions that are not followed completely Iannelli result in serious medical risk, up to and including death, or upon the discretion of your surgeon and anesthesiologist your surgery Strauss need to be rescheduled.     _X__ 1. Do not eat food after midnight the night before your procedure.                 No gum chewing or hard candies. You Manus drink clear liquids up to 2 hours                 before you are scheduled to arrive for your surgery- DO not drink clear                 liquids within 2 hours of the start of your surgery.                 Clear Liquids include:  water, apple juice without pulp, clear carbohydrate                 drink such as Clearfast or Gatorade, Black Coffee or Tea (Do not add                 anything to coffee or tea). Diabetics water only  __X__2.  On the morning of surgery brush your teeth with toothpaste and water, you                 Riege rinse your mouth with mouthwash if you wish.  Do not swallow any              toothpaste of mouthwash.     _X__ 3.  No Alcohol for 24 hours before or after surgery.   _X__ 4.  Do Not Smoke or use e-cigarettes For 24 Hours Prior to Your Surgery.                 Do not use any chewable tobacco products for at least 6 hours prior to                 surgery.  ____  5.  Bring all medications with you on the day of surgery if instructed.   __X__  6.  Notify your doctor if there is any change in your medical condition      (cold, fever, infections).     Do not wear jewelry, make-up, hairpins, clips or nail polish. Do not wear lotions, powders, or perfumes.  Do not shave body hair 48 hours prior to surgery. Men Cappuccio shave face and neck. Do not bring valuables to the hospital.    [facility] is not responsible for any  belongings or valuables.  Contacts, dentures/partials or body piercings Longo not be worn into surgery. Bring a case for your contacts, glasses or hearing aids, a denture cup will be supplied. Leave your suitcase in the car. After surgery it Cagley be brought to your room. For patients admitted to the hospital, discharge time is determined by your treatment team.   Patients discharged the day of surgery will not be allowed to drive home.   Please read over the following fact sheets that you were given:   CHG SOAP  __X__ Take these medicines the morning of surgery with A SIP OF WATER:  1. omeprazole (PRILOSEC) 20 MG capsule  2. diazepam (VALIUM) 2 MG tablet IF NEEDED  3.   4.  5.  6.  ____ Fleet Enema (as directed)   __X__ Use CHG Soap/SAGE wipes as directed  ____ Use inhalers on the day of surgery  ____ Stop metformin/Janumet/Farxiga 2 days prior to surgery    ____ Take 1/2 of usual insulin dose the night before surgery. No insulin the morning          of surgery.   ____ Stop Blood Thinners Coumadin/Plavix/Xarelto/Pleta/Pradaxa/Eliquis/Effient/Aspirin  on   Or contact your Surgeon, Cardiologist or Medical Doctor regarding  ability to stop your blood thinners  __X__ Stop Anti-inflammatories 7 days before surgery such as Advil, Ibuprofen, Motrin,  BC or Goodies Powder, Naprosyn, Naproxen, Aleve, Aspirin   STOP DICLOFENAC/VOLTAREN 7 DAYS PRIOR, Elza USE TYLENOL IF NEEDED  __X__ Stop all herbals and supplements, fish oil or vitamins until after surgery.    __X__ Bring C-Pap to the hospital.

## 2021-02-10 ENCOUNTER — Other Ambulatory Visit
Admission: RE | Admit: 2021-02-10 | Discharge: 2021-02-10 | Disposition: A | Discharge status: Home / Self Care | Payer: BC Managed Care – PPO | Source: Ambulatory Visit | Attending: General Surgery | Admitting: General Surgery

## 2021-02-10 DIAGNOSIS — Z01810 Encounter for preprocedural cardiovascular examination: Secondary | ICD-10-CM | POA: Insufficient documentation

## 2021-02-18 MED ORDER — CHLORHEXIDINE GLUCONATE 0.12 % MT SOLN
15.0000 mL | Freq: Once | OROMUCOSAL | Status: AC
  Administered 2021-02-19: 15 mL via OROMUCOSAL

## 2021-02-18 MED ORDER — CHLORHEXIDINE GLUCONATE CLOTH 2 % EX PADS: 6.0000 | MEDICATED_PAD | Freq: Once | CUTANEOUS | Status: DC

## 2021-02-18 MED ORDER — ORAL CARE MOUTH RINSE: 15.0000 mL | Freq: Once | OROMUCOSAL | Status: AC

## 2021-02-18 MED ORDER — LACTATED RINGERS IV SOLN: INTRAVENOUS | Status: DC

## 2021-02-18 MED ORDER — CEFAZOLIN IN SODIUM CHLORIDE 3-0.9 GM/100ML-% IV SOLN
3.0000 g | INTRAVENOUS | Status: DC
  Filled 2021-02-18: qty 100

## 2021-02-19 ENCOUNTER — Encounter: Payer: Self-pay | Admitting: General Surgery

## 2021-02-19 ENCOUNTER — Ambulatory Visit: Payer: BC Managed Care – PPO | Admitting: Anesthesiology

## 2021-02-19 ENCOUNTER — Other Ambulatory Visit: Payer: Self-pay

## 2021-02-19 ENCOUNTER — Encounter
Admission: RE | Disposition: A | Discharge status: Home / Self Care | Payer: Self-pay | Source: Home / Self Care | Attending: General Surgery

## 2021-02-19 ENCOUNTER — Ambulatory Visit
Admission: RE | Admit: 2021-02-19 | Discharge: 2021-02-19 | Disposition: A | Discharge status: Home / Self Care | Payer: BC Managed Care – PPO | Attending: General Surgery | Admitting: General Surgery

## 2021-02-19 DIAGNOSIS — Z87891 Personal history of nicotine dependence: Secondary | ICD-10-CM | POA: Diagnosis not present

## 2021-02-19 DIAGNOSIS — K429 Umbilical hernia without obstruction or gangrene: Secondary | ICD-10-CM | POA: Insufficient documentation

## 2021-02-19 HISTORY — PX: UMBILICAL HERNIA REPAIR: SHX196

## 2021-02-19 SURGERY — REPAIR, HERNIA, UMBILICAL, ADULT
Anesthesia: General | Site: Abdomen

## 2021-02-19 MED ORDER — DEXMEDETOMIDINE (PRECEDEX) IN NS 20 MCG/5ML (4 MCG/ML) IV SYRINGE
PREFILLED_SYRINGE | INTRAVENOUS | Status: DC | PRN
  Administered 2021-02-19 (×2): 8 ug via INTRAVENOUS

## 2021-02-19 MED ORDER — DEXAMETHASONE SODIUM PHOSPHATE 10 MG/ML IJ SOLN
INTRAMUSCULAR | Status: DC | PRN
  Administered 2021-02-19: 10 mg via INTRAVENOUS

## 2021-02-19 MED ORDER — HYDROCODONE-ACETAMINOPHEN 5-325 MG PO TABS: 1.0000 | ORAL_TABLET | ORAL | 0 refills | Status: AC | PRN

## 2021-02-19 MED ORDER — ONDANSETRON HCL 4 MG/2ML IJ SOLN
INTRAMUSCULAR | Status: DC | PRN
  Administered 2021-02-19: 4 mg via INTRAVENOUS

## 2021-02-19 MED ORDER — PHENYLEPHRINE HCL (PRESSORS) 10 MG/ML IV SOLN
INTRAVENOUS | Status: DC | PRN
  Administered 2021-02-19: 160 ug via INTRAVENOUS
  Administered 2021-02-19 (×3): 80 ug via INTRAVENOUS
  Administered 2021-02-19: 160 ug via INTRAVENOUS

## 2021-02-19 MED ORDER — EPHEDRINE SULFATE 50 MG/ML IJ SOLN
INTRAMUSCULAR | Status: DC | PRN
Start: 2021-02-19 — End: 2021-02-19
  Administered 2021-02-19 (×2): 5 mg via INTRAVENOUS

## 2021-02-19 MED ORDER — MIDAZOLAM HCL 2 MG/2ML IJ SOLN
INTRAMUSCULAR | Status: DC | PRN
  Administered 2021-02-19: 2 mg via INTRAVENOUS

## 2021-02-19 MED ORDER — PROPOFOL 10 MG/ML IV BOLUS
INTRAVENOUS | Status: DC | PRN
  Administered 2021-02-19: 200 mg via INTRAVENOUS

## 2021-02-19 MED ORDER — SODIUM CHLORIDE 0.9 % IR SOLN
Status: DC | PRN
  Administered 2021-02-19: 500 mL

## 2021-02-19 MED ORDER — ACETAMINOPHEN 10 MG/ML IV SOLN: 1000.0000 mg | Freq: Once | INTRAVENOUS | Status: DC | PRN

## 2021-02-19 MED ORDER — FENTANYL CITRATE (PF) 100 MCG/2ML IJ SOLN
25.0000 ug | INTRAMUSCULAR | Status: DC | PRN
  Administered 2021-02-19 (×3): 25 ug via INTRAVENOUS

## 2021-02-19 MED ORDER — ACETAMINOPHEN 10 MG/ML IV SOLN
INTRAVENOUS | Status: DC | PRN
  Administered 2021-02-19: 1000 mg via INTRAVENOUS

## 2021-02-19 MED ORDER — BUPIVACAINE HCL (PF) 0.5 % IJ SOLN
INTRAMUSCULAR | Status: AC
  Filled 2021-02-19: qty 30

## 2021-02-19 MED ORDER — FENTANYL CITRATE (PF) 100 MCG/2ML IJ SOLN
INTRAMUSCULAR | Status: DC | PRN
  Administered 2021-02-19 (×2): 50 ug via INTRAVENOUS

## 2021-02-19 MED ORDER — ONDANSETRON HCL 4 MG/2ML IJ SOLN
INTRAMUSCULAR | Status: AC
  Filled 2021-02-19: qty 2

## 2021-02-19 MED ORDER — LIDOCAINE HCL (CARDIAC) PF 100 MG/5ML IV SOSY
PREFILLED_SYRINGE | INTRAVENOUS | Status: DC | PRN
  Administered 2021-02-19: 100 mg via INTRAVENOUS

## 2021-02-19 MED ORDER — DEXTROSE 5 % IV SOLN
3.0000 g | INTRAVENOUS | Status: AC
  Administered 2021-02-19: 3 g via INTRAVENOUS
  Filled 2021-02-19: qty 3

## 2021-02-19 MED ORDER — LACTATED RINGERS IV SOLN: INTRAVENOUS | Status: DC

## 2021-02-19 MED ORDER — KETOROLAC TROMETHAMINE 30 MG/ML IJ SOLN
INTRAMUSCULAR | Status: AC
  Filled 2021-02-19: qty 1

## 2021-02-19 MED ORDER — LIDOCAINE HCL (PF) 2 % IJ SOLN
INTRAMUSCULAR | Status: AC
  Filled 2021-02-19: qty 5

## 2021-02-19 MED ORDER — SUCCINYLCHOLINE CHLORIDE 200 MG/10ML IV SOSY
PREFILLED_SYRINGE | INTRAVENOUS | Status: DC | PRN
  Administered 2021-02-19: 120 mg via INTRAVENOUS

## 2021-02-19 MED ORDER — DEXMEDETOMIDINE (PRECEDEX) IN NS 20 MCG/5ML (4 MCG/ML) IV SYRINGE
PREFILLED_SYRINGE | INTRAVENOUS | Status: AC
  Filled 2021-02-19: qty 5

## 2021-02-19 MED ORDER — SUGAMMADEX SODIUM 500 MG/5ML IV SOLN
INTRAVENOUS | Status: DC | PRN
  Administered 2021-02-19: 300 mg via INTRAVENOUS

## 2021-02-19 MED ORDER — MIDAZOLAM HCL 2 MG/2ML IJ SOLN
INTRAMUSCULAR | Status: AC
  Filled 2021-02-19: qty 2

## 2021-02-19 MED ORDER — ROCURONIUM BROMIDE 100 MG/10ML IV SOLN
INTRAVENOUS | Status: DC | PRN
  Administered 2021-02-19: 40 mg via INTRAVENOUS

## 2021-02-19 MED ORDER — BUPIVACAINE HCL 0.5 % IJ SOLN
INTRAMUSCULAR | Status: DC | PRN
  Administered 2021-02-19: 30 mL

## 2021-02-19 MED ORDER — KETOROLAC TROMETHAMINE 30 MG/ML IJ SOLN
INTRAMUSCULAR | Status: DC | PRN
  Administered 2021-02-19: 30 mg via INTRAVENOUS

## 2021-02-19 MED ORDER — ACETAMINOPHEN 10 MG/ML IV SOLN
INTRAVENOUS | Status: AC
  Filled 2021-02-19: qty 100

## 2021-02-19 MED ORDER — PROPOFOL 10 MG/ML IV BOLUS
INTRAVENOUS | Status: AC
  Filled 2021-02-19: qty 20

## 2021-02-19 MED ORDER — FENTANYL CITRATE (PF) 100 MCG/2ML IJ SOLN
INTRAMUSCULAR | Status: AC
  Filled 2021-02-19: qty 2

## 2021-02-19 MED ORDER — CEFAZOLIN IN SODIUM CHLORIDE 3-0.9 GM/100ML-% IV SOLN
3.0000 g | INTRAVENOUS | Status: DC
  Filled 2021-02-19: qty 100

## 2021-02-19 MED ORDER — ONDANSETRON HCL 4 MG/2ML IJ SOLN: 4.0000 mg | Freq: Once | INTRAMUSCULAR | Status: DC | PRN

## 2021-02-19 SURGICAL SUPPLY — 38 items
APL PRP STRL LF DISP 70% ISPRP (MISCELLANEOUS) ×1
APL SWBSTK 6 STRL LF DISP (MISCELLANEOUS) ×1
APPLICATOR COTTON TIP 6 STRL (MISCELLANEOUS) ×1 IMPLANT
APPLICATOR COTTON TIP 6IN STRL (MISCELLANEOUS) ×2
BLADE CLIPPER SURG (BLADE) ×2 IMPLANT
BLADE SURG 15 STRL SS SAFETY (BLADE) ×2 IMPLANT
CHLORAPREP W/TINT 26 (MISCELLANEOUS) ×2 IMPLANT
CLIPPER SURGICAL HAIR RECHARGE (MISCELLANEOUS)
DRAPE LAPAROTOMY 100X77 ABD (DRAPES) ×2 IMPLANT
DRSG TEGADERM 4X4.75 (GAUZE/BANDAGES/DRESSINGS) ×2 IMPLANT
DRSG TELFA 4X3 1S NADH ST (GAUZE/BANDAGES/DRESSINGS) ×2 IMPLANT
ELECT REM PT RETURN 9FT ADLT (ELECTROSURGICAL) ×2
ELECTRODE REM PT RTRN 9FT ADLT (ELECTROSURGICAL) ×1 IMPLANT
GAUZE 4X4 16PLY ~~LOC~~+RFID DBL (SPONGE) ×2 IMPLANT
GLOVE SURG ENC MOIS LTX SZ7.5 (GLOVE) ×2 IMPLANT
GLOVE SURG UNDER LTX SZ8 (GLOVE) ×2 IMPLANT
GOWN STRL REUS W/ TWL LRG LVL3 (GOWN DISPOSABLE) ×2 IMPLANT
GOWN STRL REUS W/TWL LRG LVL3 (GOWN DISPOSABLE) ×4
KIT TURNOVER KIT A (KITS) ×2 IMPLANT
LABEL OR SOLS (LABEL) ×2 IMPLANT
MANIFOLD NEPTUNE II (INSTRUMENTS) ×2 IMPLANT
MESH VENTRALEX ST 8CM LRG (Mesh General) ×2 IMPLANT
NEEDLE HYPO 22GX1.5 SAFETY (NEEDLE) ×2 IMPLANT
NEEDLE HYPO 25X1 1.5 SAFETY (NEEDLE) ×2 IMPLANT
NS IRRIG 500ML POUR BTL (IV SOLUTION) ×2 IMPLANT
PACK BASIN MINOR ARMC (MISCELLANEOUS) ×2 IMPLANT
STRAP SAFETY 5IN WIDE (MISCELLANEOUS) ×2 IMPLANT
STRIP CLOSURE SKIN 1/2X4 (GAUZE/BANDAGES/DRESSINGS) ×2 IMPLANT
SURGICAL HAIR CLIPPER RECHARGE (MISCELLANEOUS) IMPLANT
SUT SURGILON 0 BLK (SUTURE) ×2 IMPLANT
SUT VIC AB 3-0 54X BRD REEL (SUTURE) ×1 IMPLANT
SUT VIC AB 3-0 BRD 54 (SUTURE) ×2
SUT VIC AB 3-0 SH 27 (SUTURE) ×2
SUT VIC AB 3-0 SH 27X BRD (SUTURE) ×1 IMPLANT
SUT VIC AB 4-0 FS2 27 (SUTURE) ×2 IMPLANT
SWABSTK COMLB BENZOIN TINCTURE (MISCELLANEOUS) ×2 IMPLANT
SYR 10ML LL (SYRINGE) ×2 IMPLANT
SYR 3ML LL SCALE MARK (SYRINGE) ×2 IMPLANT

## 2021-02-19 NOTE — Anesthesia Procedure Notes (Signed)
Procedure Name: Intubation Date/Time: 02/19/2021 11:52 AM Performed by: Hezzie Bump, CRNA Pre-anesthesia Checklist: Patient identified, Patient being monitored, Timeout performed, Emergency Drugs available and Suction available Patient Re-evaluated:Patient Re-evaluated prior to induction Oxygen Delivery Method: Circle system utilized Preoxygenation: Pre-oxygenation with 100% oxygen Induction Type: IV induction Ventilation: Two handed mask ventilation required and Oral airway inserted - appropriate to patient size Laryngoscope Size: McGraph and 4 Grade View: Grade II Tube type: Oral Tube size: 7.5 mm Number of attempts: 1 Airway Equipment and Method: Stylet and Video-laryngoscopy Placement Confirmation: ETT inserted through vocal cords under direct vision, positive ETCO2 and breath sounds checked- equal and bilateral Secured at: 23 cm Tube secured with: Tape Dental Injury: Teeth and Oropharynx as per pre-operative assessment

## 2021-02-19 NOTE — Anesthesia Preprocedure Evaluation (Addendum)
Anesthesia Evaluation  Patient identified by MRN, date of birth, ID band Patient awake    Reviewed: Allergy & Precautions, NPO status , Patient's Chart, lab work & pertinent test results  History of Anesthesia Complications Negative for: history of anesthetic complications  Airway Mallampati: IV   Neck ROM: Full    Dental   Many missing teeth:   Pulmonary sleep apnea , former smoker (quit 2004),    Pulmonary exam normal breath sounds clear to auscultation       Cardiovascular hypertension, Normal cardiovascular exam Rhythm:Regular Rate:Normal  ECG 02/10/21:  Normal sinus rhythm Low voltage QRS Nonspecific T wave abnormality  Echo 02/01/18:  NORMAL LEFT VENTRICULAR SYSTOLIC FUNCTION  NORMAL RIGHT VENTRICULAR SYSTOLIC FUNCTION  TRIVIAL REGURGITATION NOTED   NO VALVULAR STENOSIS     Neuro/Psych PSYCHIATRIC DISORDERS Anxiety    GI/Hepatic GERD  ,  Endo/Other  Class 3 obesity  Renal/GU negative Renal ROS     Musculoskeletal  (+) Arthritis ,   Abdominal   Peds  Hematology negative hematology ROS (+)   Anesthesia Other Findings   Reproductive/Obstetrics                            Anesthesia Physical Anesthesia Plan  ASA: 3  Anesthesia Plan: General   Post-op Pain Management:    Induction: Intravenous  PONV Risk Score and Plan: 2 and Ondansetron, Dexamethasone and Treatment Rodden vary due to age or medical condition  Airway Management Planned: Oral ETT  Additional Equipment:   Intra-op Plan:   Post-operative Plan: Extubation in OR  Informed Consent: I have reviewed the patients History and Physical, chart, labs and discussed the procedure including the risks, benefits and alternatives for the proposed anesthesia with the patient or authorized representative who has indicated his/her understanding and acceptance.     Dental advisory given  Plan Discussed with:  CRNA  Anesthesia Plan Comments: (Patient consented for risks of anesthesia including but not limited to:  - adverse reactions to medications - damage to eyes, teeth, lips or other oral mucosa - nerve damage due to positioning  - sore throat or hoarseness - damage to heart, brain, nerves, lungs, other parts of body or loss of life  Informed patient about role of CRNA in peri- and intra-operative care.  Patient voiced understanding.)        Anesthesia Quick Evaluation

## 2021-02-19 NOTE — Discharge Instructions (Signed)
AMBULATORY SURGERY  DISCHARGE INSTRUCTIONS   The drugs that you were given will stay in your system until tomorrow so for the next 24 hours you should not:  Drive an automobile Make any legal decisions Drink any alcoholic beverage   You Rands resume regular meals tomorrow.  Today it is better to start with liquids and gradually work up to solid foods.  You Tew eat anything you prefer, but it is better to start with liquids, then soup and crackers, and gradually work up to solid foods.   Please notify your doctor immediately if you have any unusual bleeding, trouble breathing, redness and pain at the surgery site, drainage, fever, or pain not relieved by medication.     Your post-operative visit with Dr.                                       is: Date:                        Time:    Please call to schedule your post-operative visit.  Additional Instructions: 

## 2021-02-19 NOTE — Op Note (Signed)
Preoperative diagnosis: Umbilical hernia.  Postoperative diagnosis: Same.  Operative procedure: Repair of umbilical hernia with 8 cm Ventralex ST mesh.  Operating Surgeon: Donnalee Curry, MD.  Anesthesia: General endotracheal, Marcaine 0.5%, plain, 30 cc.  Estimated blood loss: Less than 5 cc.  Clinical note: This 55 year old male presented with a 2-1/2 cm fascial defect and a 8 cm mass of reducible omentum and his umbilical hernia.  He was mated for elective repair.  He received 3 g of Ancef prior to the procedure.  SCD stockings for DVT prevention.  Operative note: With the patient under adequate general endotracheal anesthesia the abdomen was cleansed with ChloraPrep and draped.  An infraumbilical incision was made after placing field block anesthesia.  The skin was incised sharply.  The adipose tissue was divided and the hernia sac separated from the overlying umbilical skin with sharp dissection.  Pinpoint bleeding controlled with electrocautery.  Hernia sac was excised at the fascial level and discarded.  The undersurface of the fascia was cleared circumferentially and 8 cm ventral light ST mesh was smoothed into the abdominal cavity.  This was then anchored and the transverse defect closed with interrupted 0 Surgilon sutures placed 5 mm apart.  The umbilical skin was tacked to the fascia with a 3-0 Vicryl figure-of-eight suture.  The adipose layer was closed in 2 layers with running 3-0 Vicryl suture.  The skin closed with a running 4-0 Vicryl subcuticular suture.  Benzoin, Steri-Strips, Telfa and Tegaderm dressing applied.  The patient tolerated the procedure well and was taken recovery room in stable condition.

## 2021-02-19 NOTE — H&P (Signed)
Martin Burns [id number] Jan 26, 1966     HPI:  55 y/o male with a symptomatic umbilical hernia with non-incarcerated omentum. For repair with prosthetic mesh.   Medications Prior to Admission  Medication Sig Dispense Refill Last Dose   amLODipine-benazepril (LOTREL) 5-40 MG capsule Take 1 capsule by mouth daily.   02/18/2021   diazepam (VALIUM) 2 MG tablet Take 2 mg by mouth every 8 (eight) hours as needed for anxiety.   02/19/2021   diclofenac (VOLTAREN) 75 MG EC tablet Take 75 mg by mouth 2 (two) times daily.   Past Week   DODEX 1000 MCG/ML injection Inject 1,000 mcg into the muscle every 30 (thirty) days.   02/10/2021   ferrous sulfate 325 (65 FE) MG tablet Take 325 mg by mouth daily with breakfast.   02/17/2021   fluocinonide ointment (LIDEX) 0.05 % Apply 1 application topically 2 (two) times daily as needed (eczema).   Past Month   fluticasone (FLONASE) 50 MCG/ACT nasal spray Place 2 sprays into both nostrils daily. 16 g 2 02/18/2021   Multiple Vitamins-Minerals (MULTIVITAMIN ADULT PO) Take 1 tablet by mouth daily.   02/18/2021   omeprazole (PRILOSEC) 20 MG capsule Take 20 mg by mouth daily.   02/18/2021   Allergies  Allergen Reactions   Codeine Nausea And Vomiting   Past Medical History:  Diagnosis Date   Anxiety    Arthritis    GERD (gastroesophageal reflux disease)    Hypertension    Sleep apnea    Vitamin B 12 deficiency    Past Surgical History:  Procedure Laterality Date   BACK SURGERY     COLONOSCOPY WITH PROPOFOL N/A 04/27/2018   Procedure: COLONOSCOPY WITH PROPOFOL;  Surgeon: Toledo, Boykin Nearing, MD;  Location: [facility] ENDOSCOPY;  Service: Gastroenterology;  Laterality: N/A;   ESOPHAGOGASTRODUODENOSCOPY N/A 04/27/2018   Procedure: ESOPHAGOGASTRODUODENOSCOPY (EGD);  Surgeon: Toledo, Boykin Nearing, MD;  Location: [facility] ENDOSCOPY;  Service: Gastroenterology;  Laterality: N/A;   FRACTURE SURGERY Left    thumb   TONSILLECTOMY     Social History   Socioeconomic History   Marital  status: Married    Spouse name: Not on file   Number of children: Not on file   Years of education: Not on file   Highest education level: Not on file  Occupational History   Not on file  Tobacco Use   Smoking status: Former    Types: Cigarettes    Quit date: 2004    Years since quitting: 18.8   Smokeless tobacco: Former  Building services engineer Use: Never used  Substance and Sexual Activity   Alcohol use: Yes    Comment: occasionally   Drug use: No   Sexual activity: Yes  Other Topics Concern   Not on file  Social History Narrative   Not on file   Social Determinants of Health   Financial Resource Strain: Not on file  Food Insecurity: Not on file  Transportation Needs: Not on file  Physical Activity: Not on file  Stress: Not on file  Social Connections: Not on file  Intimate Partner Violence: Not on file   Social History   Social History Narrative   Not on file     ROS: Negative.     PE: HEENT: Negative. Lungs: Clear. Cardio: RR.   Assessment/Plan:  Proceed with planned umbilical hernia repair with prosthetic mesh.  Merrily Pew [facility] 02/19/2021

## 2021-02-19 NOTE — Transfer of Care (Signed)
Immediate Anesthesia Transfer of Care Note  Patient: Martin Burns  Procedure(s) Performed: HERNIA REPAIR UMBILICAL ADULT (Abdomen)  Patient Location: PACU  Anesthesia Type:General  Level of Consciousness: awake and alert   Airway & Oxygen Therapy: Patient Spontanous Breathing and Patient connected to face mask oxygen  Post-op Assessment: Report given to RN and Post -op Vital signs reviewed and stable  Post vital signs: Reviewed and stable  Last Vitals:  Vitals Value Taken Time  BP 108/56 02/19/21 1300  Temp 36.3 C 02/19/21 1300  Pulse 107 02/19/21 1301  Resp 24 02/19/21 1301  SpO2 96 % 02/19/21 1301  Vitals shown include unvalidated device data.  Last Pain:  Vitals:   02/19/21 1059  TempSrc: Temporal  PainSc: 0-No pain         Complications: No notable events documented.

## 2021-02-19 NOTE — Anesthesia Postprocedure Evaluation (Signed)
Anesthesia Post Note  Patient: [name]  Procedure(s) Performed: HERNIA REPAIR UMBILICAL ADULT (Abdomen)  Patient location during evaluation: PACU Anesthesia Type: General Level of consciousness: awake and alert, oriented and patient cooperative Pain management: pain level controlled Vital Signs Assessment: post-procedure vital signs reviewed and stable Respiratory status: spontaneous breathing, nonlabored ventilation and respiratory function stable Cardiovascular status: blood pressure returned to baseline and stable Postop Assessment: adequate PO intake Anesthetic complications: no   No notable events documented.   Last Vitals:  Vitals:   02/19/21 1400 02/19/21 1420  BP: 113/73   Pulse: 89   Resp: 12   Temp: (!) 36.1 C (!) 35.8 C  SpO2: 90%     Last Pain:  Vitals:   02/19/21 1420  TempSrc: Temporal  PainSc:                  Reed Breech

## 2021-02-20 ENCOUNTER — Encounter: Payer: Self-pay | Admitting: General Surgery

## 2022-03-23 ENCOUNTER — Ambulatory Visit: Payer: Self-pay
